# Patient Record
Sex: Male | Born: 1965 | Race: White | Hispanic: No | Marital: Married | Smoking: Never smoker
Health system: Southern US, Community
[De-identification: ages and names within clinical notes are randomized; demographics above are authoritative.]

## PROBLEM LIST (undated history)

## (undated) DIAGNOSIS — E785 Hyperlipidemia, unspecified: Secondary | ICD-10-CM

## (undated) HISTORY — DX: Hyperlipidemia, unspecified: E78.5

---

## 2002-11-23 ENCOUNTER — Ambulatory Visit (HOSPITAL_COMMUNITY): Admission: RE | Admit: 2002-11-23 | Discharge: 2002-11-23 | Payer: Self-pay | Admitting: Internal Medicine

## 2005-01-15 ENCOUNTER — Ambulatory Visit: Payer: Self-pay | Admitting: Cardiology

## 2005-01-22 ENCOUNTER — Ambulatory Visit: Payer: Self-pay | Admitting: Cardiology

## 2015-10-08 MED FILL — RELPAX 40 MG TABLET: 40 | 30 days supply | Qty: 9 | Fill #0

## 2015-11-08 MED FILL — RELPAX 40 MG TABLET: 40 | 30 days supply | Qty: 9 | Fill #0

## 2015-12-06 MED FILL — RELPAX 40 MG TABLET: 40 | 30 days supply | Qty: 9 | Fill #0

## 2016-01-08 MED FILL — RELPAX 40 MG TABLET: 40 | 30 days supply | Qty: 9 | Fill #1

## 2016-02-05 MED FILL — RELPAX 40 MG TABLET: 40 | 30 days supply | Qty: 9 | Fill #0

## 2016-03-09 MED FILL — RELPAX 40 MG TABLET: 40 | 30 days supply | Qty: 9 | Fill #0

## 2016-04-16 MED FILL — RELPAX 40 MG TABLET: 40 | 30 days supply | Qty: 9 | Fill #0

## 2016-05-14 MED FILL — ELETRIPTAN HBR 40 MG TABLET: 40 | 30 days supply | Qty: 9 | Fill #1

## 2016-06-10 MED FILL — ELETRIPTAN HBR 40 MG TABLET: 40 | 90 days supply | Qty: 27 | Fill #0

## 2016-07-29 ENCOUNTER — Encounter (INDEPENDENT_AMBULATORY_CARE_PROVIDER_SITE_OTHER): Payer: Self-pay | Admitting: Sports Medicine

## 2016-07-31 ENCOUNTER — Other Ambulatory Visit (INDEPENDENT_AMBULATORY_CARE_PROVIDER_SITE_OTHER): Payer: Self-pay | Admitting: Sports Medicine

## 2016-07-31 MED ORDER — AMOXICILLIN-POT CLAVULANATE 875-125 MG PO TABS: 1.0000 | ORAL_TABLET | Freq: Two times a day (BID) | ORAL | 0 refills | Status: DC

## 2016-07-31 NOTE — Progress Notes (Signed)
Tamiflu called in on Wednesday night due to persistent myalgias & fevers. Patient is asplenic & will call in additional prescription for Augmentin for prophylactic treatment.

## 2016-08-18 ENCOUNTER — Other Ambulatory Visit (INDEPENDENT_AMBULATORY_CARE_PROVIDER_SITE_OTHER): Payer: Self-pay | Admitting: Family

## 2016-08-18 MED ORDER — ELETRIPTAN HYDROBROMIDE 40 MG PO TABS: 40.0000 mg | ORAL_TABLET | ORAL | 3 refills | Status: DC | PRN

## 2016-09-02 MED FILL — ELETRIPTAN HBR 40 MG TABLET: 40 | 90 days supply | Qty: 27 | Fill #0

## 2016-09-24 ENCOUNTER — Telehealth (INDEPENDENT_AMBULATORY_CARE_PROVIDER_SITE_OTHER): Payer: Self-pay | Admitting: Sports Medicine

## 2016-09-24 MED ORDER — AMOXICILLIN-POT CLAVULANATE 875-125 MG PO TABS: 1.0000 | ORAL_TABLET | Freq: Two times a day (BID) | ORAL | 0 refills | Status: DC

## 2016-09-24 MED FILL — AMOX-CLAV 875-125 MG TABLET: 875-125 | 10 days supply | Qty: 20 | Fill #0

## 2016-09-24 NOTE — Telephone Encounter (Signed)
Due to his worsening reductive cough with fevers of 100.7F.  His been having writers and mild DOE without associated respiratory distress.  History of splenectomy.  We'll then treat empirically with 10 days of Augmentin.

## 2016-11-12 ENCOUNTER — Other Ambulatory Visit (INDEPENDENT_AMBULATORY_CARE_PROVIDER_SITE_OTHER): Payer: Self-pay | Admitting: Radiology

## 2016-11-12 MED ORDER — ELETRIPTAN HYDROBROMIDE 40 MG PO TABS: 40.0000 mg | ORAL_TABLET | ORAL | 3 refills | Status: DC | PRN

## 2016-11-13 ENCOUNTER — Ambulatory Visit (INDEPENDENT_AMBULATORY_CARE_PROVIDER_SITE_OTHER): Payer: Self-pay | Admitting: Family

## 2016-11-13 ENCOUNTER — Encounter (INDEPENDENT_AMBULATORY_CARE_PROVIDER_SITE_OTHER): Payer: Self-pay

## 2016-11-13 DIAGNOSIS — R519 Headache, unspecified: Secondary | ICD-10-CM

## 2016-11-13 DIAGNOSIS — R51 Headache: Secondary | ICD-10-CM

## 2016-11-13 MED FILL — ELETRIPTAN HBR 40 MG TABLET: 40 | 30 days supply | Qty: 12 | Fill #0

## 2016-11-19 NOTE — Progress Notes (Signed)
   Office Visit Note   Patient: Walter PressmanGregory S Jennings           Date of Birth: 05-08-1966           MRN: [id number] Visit Date: 11/13/2016              Requested by: No referring provider defined for this encounter. PCP: No primary care provider on file.   Assessment & Plan: Visit Diagnoses: No diagnosis found.  Plan: Relpax refilled. Will follow up as needed.   Follow-Up Instructions: Return if symptoms worsen or fail to improve.   Orders:  No orders of the defined types were placed in this encounter.  No orders of the defined types were placed in this encounter.     Procedures: No procedures performed   Clinical Data: No additional findings.   Subjective: No chief complaint on file.   The patient is a [age] year old gentleman who presents today for evaluation of a intermittent headaches. This has been ongoing for many years. Has been obtaining good relief with Relpax for quite sometime.     Review of Systems  Constitutional: Negative for chills and fever.  Eyes: Negative for photophobia, pain and visual disturbance.  Gastrointestinal: Negative for nausea.  Musculoskeletal: Negative for neck pain.  Neurological: Positive for headaches. Negative for speech difficulty, weakness and numbness.     Objective: Vital Signs: There were no vitals taken for this visit.  Physical Exam  Constitutional: He is oriented to person, place, and time. He appears well-developed and well-nourished.  HENT:  Head: Normocephalic and atraumatic.  Eyes: EOM are normal. Pupils are equal, round, and reactive to light.  Neck: Normal range of motion. Neck supple.  Pulmonary/Chest: Effort normal.  Neurological: He is alert and oriented to person, place, and time. No cranial nerve deficit. He exhibits normal muscle tone. Coordination normal.  Skin: Skin is warm and dry.  Psychiatric: He has a normal mood and affect. Thought content normal.    Ortho Exam  Specialty Comments:  No specialty  comments available.  Imaging: No results found.   PMFS History: There are no active problems to display for this patient.  No past medical history on file.  No family history on file.  No past surgical history on file. Social History   Occupational History  . Not on file.   Social History Main Topics  . Smoking status: Not on file  . Smokeless tobacco: Not on file  . Alcohol use Not on file  . Drug use: Unknown  . Sexual activity: Not on file

## 2016-12-14 ENCOUNTER — Other Ambulatory Visit (INDEPENDENT_AMBULATORY_CARE_PROVIDER_SITE_OTHER): Payer: Self-pay | Admitting: Radiology

## 2016-12-14 MED ORDER — SCOPOLAMINE 1 MG/3DAYS TD PT72: 2.0000 | MEDICATED_PATCH | TRANSDERMAL | 0 refills | Status: DC

## 2016-12-14 MED FILL — SCOPOLAMINE 1 MG/3 DAY PATC: 1 | 3 days supply | Qty: 2 | Fill #0

## 2017-01-08 ENCOUNTER — Other Ambulatory Visit (INDEPENDENT_AMBULATORY_CARE_PROVIDER_SITE_OTHER): Payer: Self-pay | Admitting: Radiology

## 2017-01-08 MED ORDER — ELETRIPTAN HYDROBROMIDE 40 MG PO TABS: 40.0000 mg | ORAL_TABLET | ORAL | 3 refills | Status: DC | PRN

## 2017-01-08 MED FILL — ELETRIPTAN HBR 40 MG TABLET: 40 | 30 days supply | Qty: 12 | Fill #0

## 2017-03-03 MED FILL — ELETRIPTAN HBR 40 MG TABLET: 40 | 30 days supply | Qty: 12 | Fill #1

## 2017-05-28 ENCOUNTER — Other Ambulatory Visit (INDEPENDENT_AMBULATORY_CARE_PROVIDER_SITE_OTHER): Payer: Self-pay | Admitting: Radiology

## 2017-05-28 MED ORDER — ELETRIPTAN HYDROBROMIDE 40 MG PO TABS: 40.0000 mg | ORAL_TABLET | ORAL | 3 refills | Status: DC | PRN

## 2017-06-02 ENCOUNTER — Other Ambulatory Visit (INDEPENDENT_AMBULATORY_CARE_PROVIDER_SITE_OTHER): Payer: Self-pay | Admitting: Radiology

## 2017-06-02 MED FILL — ELETRIPTAN HBR 40 MG TABLET: 40 | 30 days supply | Qty: 12 | Fill #2

## 2017-07-27 ENCOUNTER — Other Ambulatory Visit (INDEPENDENT_AMBULATORY_CARE_PROVIDER_SITE_OTHER): Payer: Self-pay

## 2017-07-27 MED ORDER — ELETRIPTAN HYDROBROMIDE 40 MG PO TABS: 40.0000 mg | ORAL_TABLET | ORAL | 3 refills | Status: DC | PRN

## 2017-08-02 MED FILL — ELETRIPTAN HBR 40 MG TABLET: 40 | 30 days supply | Qty: 12 | Fill #3

## 2018-01-27 ENCOUNTER — Other Ambulatory Visit (INDEPENDENT_AMBULATORY_CARE_PROVIDER_SITE_OTHER): Payer: Self-pay | Admitting: Radiology

## 2018-01-27 MED ORDER — ELETRIPTAN HYDROBROMIDE 40 MG PO TABS: 40.0000 mg | ORAL_TABLET | ORAL | 3 refills | Status: DC | PRN

## 2018-01-27 MED FILL — ELETRIPTAN HYDROBROMIDE 40: 40 | 30 days supply | Qty: 12 | Fill #0

## 2018-05-16 ENCOUNTER — Other Ambulatory Visit (INDEPENDENT_AMBULATORY_CARE_PROVIDER_SITE_OTHER): Payer: Self-pay | Admitting: Radiology

## 2018-05-16 MED ORDER — ELETRIPTAN HYDROBROMIDE 40 MG PO TABS: 40.0000 mg | ORAL_TABLET | ORAL | 3 refills | Status: DC | PRN

## 2018-05-16 MED FILL — ELETRIPTAN HYDROBROMIDE 40: 40 | 30 days supply | Qty: 8 | Fill #0

## 2018-08-12 ENCOUNTER — Other Ambulatory Visit (INDEPENDENT_AMBULATORY_CARE_PROVIDER_SITE_OTHER): Payer: Self-pay | Admitting: Radiology

## 2018-08-12 MED ORDER — ELETRIPTAN HYDROBROMIDE 40 MG PO TABS: 40.0000 mg | ORAL_TABLET | ORAL | 3 refills | Status: DC | PRN

## 2018-08-12 MED FILL — ELETRIPTAN HYDROBROMIDE 40: 40 | 90 days supply | Qty: 27 | Fill #0

## 2018-08-24 ENCOUNTER — Ambulatory Visit (INDEPENDENT_AMBULATORY_CARE_PROVIDER_SITE_OTHER): Payer: 59 | Admitting: Family

## 2018-08-24 DIAGNOSIS — R519 Headache, unspecified: Secondary | ICD-10-CM

## 2018-08-24 DIAGNOSIS — R51 Headache: Principal | ICD-10-CM

## 2018-08-31 ENCOUNTER — Encounter (INDEPENDENT_AMBULATORY_CARE_PROVIDER_SITE_OTHER): Payer: Self-pay | Admitting: Family

## 2018-08-31 NOTE — Progress Notes (Deleted)
SUBJECTIVE: Walter PressmanGregory S Jennings is a 52 y.o. male who complains of headaches for *** {gen duration:315003}. Description of pain: {headache description:315282}. Duration of individual headaches: *** {gen duration:315003}, frequency {gen frequencies:315330}. Associated symptoms: {hx headache assoc sx:315274}. Pain relief: {headache relief:315277}. Precipitating factors: {headache precipitating factors:315279}. He {has/denies:315300::"denies"} a history of recent head injury.  Prior neurological history: negative for {neuro past hx:315716}. Neurologic Review of Systems - {cns ros:315740}.  Scheduled Meds: Continuous Infusions: PRN Meds:   OBJECTIVE: Appearance: {appearance:315021::"alert, well appearing, and in no distress"}. Neurological Exam: {neuro:315902::"alert, oriented, normal speech, no focal findings or movement disorder noted"}.  ASSESSMENT: {headache NW:295621}dx:315280}.  PLAN: Recommendations: {headache recommend:315281::"lie in darkened room and apply cold packs prn for pain","side effect profile discussed in detail","asked to keep headache diary","patient reassured that neurodiagnostic workup not indicated from benign H & P"}. See orders for this visit as documented in the electronic medical record.

## 2018-09-09 DIAGNOSIS — H5712 Ocular pain, left eye: Secondary | ICD-10-CM | POA: Diagnosis not present

## 2018-12-16 ENCOUNTER — Other Ambulatory Visit (INDEPENDENT_AMBULATORY_CARE_PROVIDER_SITE_OTHER): Payer: Self-pay | Admitting: Family Medicine

## 2018-12-16 MED ORDER — LEVOFLOXACIN 750 MG PO TABS: 750.0000 mg | ORAL_TABLET | Freq: Every day | ORAL | 0 refills | Status: DC

## 2018-12-16 MED FILL — ELETRIPTAN HYDROBROMIDE 40: 40 | 90 days supply | Qty: 27 | Fill #1

## 2018-12-19 MED FILL — levoFLOXacin 750 MG TABS: 750 | 14 days supply | Qty: 14 | Fill #0

## 2018-12-23 ENCOUNTER — Ambulatory Visit (INDEPENDENT_AMBULATORY_CARE_PROVIDER_SITE_OTHER): Payer: 59 | Admitting: Family Medicine

## 2018-12-23 ENCOUNTER — Encounter (INDEPENDENT_AMBULATORY_CARE_PROVIDER_SITE_OTHER): Payer: Self-pay | Admitting: Family Medicine

## 2018-12-23 ENCOUNTER — Other Ambulatory Visit: Payer: Self-pay

## 2018-12-23 DIAGNOSIS — R0981 Nasal congestion: Secondary | ICD-10-CM

## 2018-12-23 DIAGNOSIS — Z8669 Personal history of other diseases of the nervous system and sense organs: Secondary | ICD-10-CM

## 2018-12-23 MED ORDER — ELETRIPTAN HYDROBROMIDE 40 MG PO TABS: 40.0000 mg | ORAL_TABLET | ORAL | 11 refills | Status: DC | PRN

## 2018-12-23 NOTE — Progress Notes (Signed)
Subjective: Patient had respiratory illness last week while out of town.  Symptoms have improved significantly, now feeling back to normal.  Levaquin was prescribed.  Impression: Resolved respiratory illness  Plan: Follow-up as needed.

## 2018-12-26 ENCOUNTER — Other Ambulatory Visit (INDEPENDENT_AMBULATORY_CARE_PROVIDER_SITE_OTHER): Payer: Self-pay | Admitting: Family Medicine

## 2018-12-26 ENCOUNTER — Telehealth (INDEPENDENT_AMBULATORY_CARE_PROVIDER_SITE_OTHER): Payer: Self-pay | Admitting: Family Medicine

## 2018-12-26 MED ORDER — CHLOROQUINE PHOSPHATE 500 MG PO TABS: 500.0000 mg | ORAL_TABLET | Freq: Two times a day (BID) | ORAL | 1 refills | Status: DC

## 2018-12-26 MED ORDER — HYDROXYCHLOROQUINE SULFATE 200 MG PO TABS: 200.0000 mg | ORAL_TABLET | Freq: Three times a day (TID) | ORAL | 1 refills | Status: DC

## 2018-12-26 MED ORDER — AZITHROMYCIN 250 MG PO TABS: ORAL_TABLET | ORAL | 0 refills | Status: DC

## 2018-12-26 NOTE — Telephone Encounter (Signed)
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  He has family member with autoimmune disease who is at high risk for complications related to contracting Covid-19.  He understands it is off-label use, but we have chosen to call in chloroquine and azithromycin to be used only if he develops symptoms consistent with Covid-19 infection.

## 2018-12-28 ENCOUNTER — Telehealth (INDEPENDENT_AMBULATORY_CARE_PROVIDER_SITE_OTHER): Payer: Self-pay | Admitting: Family Medicine

## 2018-12-28 ENCOUNTER — Encounter (INDEPENDENT_AMBULATORY_CARE_PROVIDER_SITE_OTHER): Payer: Self-pay | Admitting: Family Medicine

## 2018-12-28 DIAGNOSIS — Z9081 Acquired absence of spleen: Secondary | ICD-10-CM | POA: Insufficient documentation
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
## 2019-08-21 NOTE — Progress Notes (Signed)
Referring-Self Referral Reason for referral-Hyperlipidemia  HPI: 53 year old male for evaluation of hyperlipidemia.  Patient has dyspnea with more vigorous activities.  Not routine activities.  No orthopnea, PND, pedal edema, chest pain or syncope.  Current Outpatient Medications  Medication Sig Dispense Refill  . amoxicillin-clavulanate (AUGMENTIN) 875-125 MG tablet Take 1 tablet by mouth 2 (two) times daily. 20 tablet 0  . azithromycin (ZITHROMAX Z-PAK) 250 MG tablet Take as directed. 6 each 0  . chloroquine (ARALEN) 500 MG tablet Take 1 tablet (500 mg total) by mouth 2 (two) times daily. 12 tablet 1  . eletriptan (RELPAX) 40 MG tablet Take 1 tablet (40 mg total) by mouth as needed for migraine or headache. May repeat in 2 hours if headache persists or recurs. 30 tablet 11  . hydroxychloroquine (PLAQUENIL) 200 MG tablet Take 1 tablet (200 mg total) by mouth 3 (three) times daily. 30 tablet 1  . levofloxacin (LEVAQUIN) 750 MG tablet Take 1 tablet (750 mg total) by mouth daily. 14 tablet 0  . scopolamine (TRANSDERM-SCOP, 1.5 MG,) 1 MG/3DAYS Place 2 patches (3 mg total) onto the skin every 3 (three) days. 2 patch 0   No current facility-administered medications for this visit.     Not on File  No past medical history on file.  No past surgical history on file.  Social History   Socioeconomic History  . Marital status: Married    Spouse name: Not on file  . Number of children: Not on file  . Years of education: Not on file  . Highest education level: Not on file  Occupational History  . Not on file  Social Needs  . Financial resource strain: Not on file  . Food insecurity    Worry: Not on file    Inability: Not on file  . Transportation needs    Medical: Not on file    Non-medical: Not on file  Tobacco Use  . Smoking status: Not on file  Substance and Sexual Activity  . Alcohol use: Not on file  . Drug use: Not on file  . Sexual activity: Not on file  Lifestyle  .  Physical activity    Days per week: Not on file    Minutes per session: Not on file  . Stress: Not on file  Relationships  . Social Musician on phone: Not on file    Gets together: Not on file    Attends religious service: Not on file    Active member of club or organization: Not on file    Attends meetings of clubs or organizations: Not on file    Relationship status: Not on file  . Intimate partner violence    Fear of current or ex partner: Not on file    Emotionally abused: Not on file    Physically abused: Not on file    Forced sexual activity: Not on file  Other Topics Concern  . Not on file  Social History Narrative  . Not on file    No family history on file.  ROS: no fevers or chills, productive cough, hemoptysis, dysphasia, odynophagia, melena, hematochezia, dysuria, hematuria, rash, seizure activity, orthopnea, PND, pedal edema, claudication. Remaining systems are negative.  Physical Exam:   There were no vitals taken for this visit.  General:  Well developed/well nourished in NAD Skin warm/dry Patient not depressed No peripheral clubbing Back-normal HEENT-normal/normal eyelids Neck supple/normal carotid upstroke bilaterally; no bruits; no JVD; no thyromegaly chest -  CTA/ normal expansion CV - RRR/normal S1 and S2; no murmurs, rubs or gallops;  PMI nondisplaced Abdomen -NT/ND, no HSM, no mass, + bowel sounds, no bruit 2+ femoral pulses, no bruits Ext-no edema, chords, 2+ DP Neuro-grossly nonfocal  ECG -sinus bradycardia, RV conduction delay, no ST changes.  Personally reviewed  A/P  1 hyperlipidemia-patient has a history of hyperlipidemia treated with statin.  He is presently not taking this medication.  We will check baseline lipids and treat accordingly.  2 dyspnea-mild dyspnea with vigorous activities.  Also with family history of coronary disease and hyperlipidemia.  We will arrange a calcium score for risk stratification.  Kirk Ruths, MD

## 2019-08-22 ENCOUNTER — Ambulatory Visit (INDEPENDENT_AMBULATORY_CARE_PROVIDER_SITE_OTHER): Payer: 59 | Admitting: Family Medicine

## 2019-08-22 ENCOUNTER — Encounter: Payer: Self-pay | Admitting: Family Medicine

## 2019-08-22 ENCOUNTER — Other Ambulatory Visit: Payer: Self-pay

## 2019-08-22 DIAGNOSIS — M546 Pain in thoracic spine: Secondary | ICD-10-CM

## 2019-08-22 DIAGNOSIS — M542 Cervicalgia: Secondary | ICD-10-CM

## 2019-08-22 DIAGNOSIS — M545 Low back pain, unspecified: Secondary | ICD-10-CM

## 2019-08-22 MED ORDER — TIZANIDINE HCL 2 MG PO TABS: 2.0000 mg | ORAL_TABLET | Freq: Four times a day (QID) | ORAL | 3 refills | Status: DC | PRN
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
## 2019-09-04 ENCOUNTER — Other Ambulatory Visit: Payer: Self-pay

## 2019-09-04 ENCOUNTER — Encounter: Payer: Self-pay | Admitting: Cardiology

## 2019-09-04 ENCOUNTER — Ambulatory Visit (INDEPENDENT_AMBULATORY_CARE_PROVIDER_SITE_OTHER): Payer: 59 | Admitting: Cardiology

## 2019-09-04 VITALS — BP 118/74 | HR 58 | Temp 97.8°F | Ht 71.0 in | Wt 176.0 lb

## 2019-09-04 DIAGNOSIS — E7800 Pure hypercholesterolemia, unspecified: Secondary | ICD-10-CM | POA: Diagnosis not present

## 2019-09-04 DIAGNOSIS — R0602 Shortness of breath: Secondary | ICD-10-CM | POA: Diagnosis not present

## 2019-09-04 NOTE — Patient Instructions (Signed)
Medication Instructions:  NO CHANGE *If you need a refill on your cardiac medications before your next appointment, please call your pharmacy*  Lab Work: If you have labs (blood work) drawn today and your tests are completely normal, you will receive your results only by: Marland Kitchen MyChart Message (if you have MyChart) OR . A paper copy in the mail If you have any lab test that is abnormal or we need to change your treatment, we will call you to review the results.  Testing/Procedures: CARDIAC CALCIUM SCORE AT [place]  Follow-Up: At [facility], you and your health needs are our priority.  As part of our continuing mission to provide you with exceptional heart care, we have created designated Provider Care Teams.  These Care Teams include your primary Cardiologist (physician) and Advanced Practice Providers (APPs -  Physician Assistants and Nurse Practitioners) who all work together to provide you with the care you need, when you need it.  Your physician recommends that you schedule a follow-up appointment in: AS NEEDED

## 2019-09-04 NOTE — Addendum Note (Signed)
Addended by: Lelon Perla on: 09/04/2019 12:14 PM   Modules accepted: Level of Service

## 2019-09-22 DIAGNOSIS — H5203 Hypermetropia, bilateral: Secondary | ICD-10-CM | POA: Diagnosis not present

## 2019-09-22 DIAGNOSIS — H52203 Unspecified astigmatism, bilateral: Secondary | ICD-10-CM | POA: Diagnosis not present

## 2019-09-22 DIAGNOSIS — H524 Presbyopia: Secondary | ICD-10-CM | POA: Diagnosis not present

## 2019-10-11 ENCOUNTER — Ambulatory Visit (INDEPENDENT_AMBULATORY_CARE_PROVIDER_SITE_OTHER)
Admission: RE | Admit: 2019-10-11 | Discharge: 2019-10-11 | Disposition: A | Discharge status: Home / Self Care | Payer: Self-pay | Source: Ambulatory Visit | Attending: Cardiology | Admitting: Cardiology

## 2019-10-11 ENCOUNTER — Other Ambulatory Visit: Payer: Self-pay

## 2019-10-11 DIAGNOSIS — R0602 Shortness of breath: Secondary | ICD-10-CM

## 2019-10-12 ENCOUNTER — Other Ambulatory Visit: Payer: Self-pay

## 2019-10-12 ENCOUNTER — Other Ambulatory Visit: Payer: Self-pay | Admitting: Family Medicine

## 2019-10-12 MED ORDER — ELETRIPTAN HYDROBROMIDE 40 MG PO TABS: 40.0000 mg | ORAL_TABLET | ORAL | 11 refills | Status: DC | PRN

## 2019-10-12 NOTE — Progress Notes (Signed)
Patient called wanted medication sent to [facility] outpatient pharmacy instead. I submitted rx.

## 2019-10-16 ENCOUNTER — Encounter: Payer: Self-pay | Admitting: Family Medicine

## 2019-10-17 ENCOUNTER — Ambulatory Visit: Payer: 59 | Admitting: Family Medicine

## 2019-10-17 ENCOUNTER — Other Ambulatory Visit: Payer: Self-pay | Admitting: Family Medicine

## 2019-10-17 MED ORDER — SUMATRIPTAN SUCCINATE 100 MG PO TABS: 100.0000 mg | ORAL_TABLET | ORAL | 6 refills | Status: AC | PRN

## 2019-10-17 MED FILL — SUMAtriptan SUCCINATE 100 M: 100 | 30 days supply | Qty: 9 | Fill #0

## 2019-10-18 ENCOUNTER — Other Ambulatory Visit: Payer: Self-pay | Admitting: Family Medicine

## 2019-10-18 MED ORDER — ELETRIPTAN HYDROBROMIDE 40 MG PO TABS: 40.0000 mg | ORAL_TABLET | ORAL | 11 refills | Status: DC | PRN

## 2019-10-20 ENCOUNTER — Other Ambulatory Visit: Payer: Self-pay

## 2019-10-20 ENCOUNTER — Encounter: Payer: Self-pay | Admitting: Family Medicine

## 2019-10-20 ENCOUNTER — Ambulatory Visit (INDEPENDENT_AMBULATORY_CARE_PROVIDER_SITE_OTHER): Payer: 59 | Admitting: Family Medicine

## 2019-10-20 ENCOUNTER — Ambulatory Visit: Payer: Self-pay

## 2019-10-20 VITALS — BP 115/81 | HR 74

## 2019-10-20 DIAGNOSIS — M542 Cervicalgia: Secondary | ICD-10-CM

## 2019-10-20 DIAGNOSIS — M546 Pain in thoracic spine: Secondary | ICD-10-CM

## 2019-10-20 DIAGNOSIS — M545 Low back pain, unspecified: Secondary | ICD-10-CM

## 2019-10-20 DIAGNOSIS — R519 Headache, unspecified: Secondary | ICD-10-CM | POA: Diagnosis not present

## 2019-10-20 MED FILL — ELETRIPTAN HBR 40 MG TABLET: 40 | 30 days supply | Qty: 12 | Fill #0
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
## 2019-12-27 ENCOUNTER — Telehealth: Payer: Self-pay | Admitting: Cardiology

## 2019-12-27 DIAGNOSIS — E7800 Pure hypercholesterolemia, unspecified: Secondary | ICD-10-CM

## 2019-12-27 NOTE — Telephone Encounter (Signed)
Wife of the patient called. The patient had labs done at [facility]. The wife got the results and wanted to know how she can pass them along to Dr. Jens Som. Please advise

## 2019-12-27 NOTE — Telephone Encounter (Signed)
Attempted to reach the patient. The wife is not listed on the dpr. Message has not been left. Will try back later.

## 2019-12-29 NOTE — Telephone Encounter (Signed)
Spoke with the pts wife and she is aware that she is not on the DPR she had already to sent the pt lab results she wants looked at to Dr. Ludwig Clarks  cell phone... I asked her to ask Dr. August Saucer about his My Chart and if she had access to that she could send directly to Dr. Jens Som and have it in the pts chart. She says she will work on it and talk with him about the DPR.

## 2019-12-29 NOTE — Telephone Encounter (Signed)
New message:     Patient wife calling to give some information to the doctor. There is a note concerning this matter. Please call patient wife.

## 2020-01-01 NOTE — Telephone Encounter (Signed)
Left message for pt to call, per dr Jens Som he will need to start crestor 20 mg once daily. He will need lipid and hepatic checked in 12 weeks. The patient needs to send [place] a copy of his lab work for his chart.

## 2020-01-02 ENCOUNTER — Encounter: Payer: Self-pay | Admitting: *Deleted

## 2020-01-02 NOTE — Telephone Encounter (Signed)
Message sent to patient vis my chart to find out what pharmacy to send prescription to.

## 2020-01-03 MED ORDER — ROSUVASTATIN CALCIUM 20 MG PO TABS: 20.0000 mg | ORAL_TABLET | Freq: Every day | ORAL | 3 refills | Status: DC

## 2020-01-03 NOTE — Telephone Encounter (Signed)
Spoke with pt, Aware of dr crenshaw's recommendations. New script sent to the pharmacy and Lab orders mailed to the pt  

## 2020-01-19 MED FILL — ROSUVASTATIN CALCIUM 20 MG: 20 | 90 days supply | Qty: 90 | Fill #0

## 2020-03-06 ENCOUNTER — Other Ambulatory Visit: Payer: Self-pay | Admitting: Family Medicine

## 2020-03-06 MED ORDER — ELETRIPTAN HYDROBROMIDE 40 MG PO TABS: 40.0000 mg | ORAL_TABLET | ORAL | 11 refills | Status: DC | PRN

## 2020-03-06 MED FILL — ELETRIPTAN HYDROBROMIDE 40: 40 | 30 days supply | Qty: 12 | Fill #0

## 2020-03-19 MED FILL — ELETRIPTAN HYDROBROMIDE 40: 40 | 30 days supply | Qty: 12 | Fill #0

## 2020-08-07 MED FILL — ELETRIPTAN HYDROBROMIDE 40: 40 | 30 days supply | Qty: 12 | Fill #1

## 2020-10-01 ENCOUNTER — Telehealth: Payer: Self-pay

## 2020-10-01 NOTE — Telephone Encounter (Signed)
Pt is not a  Pt of Dr. Blair Heys, however, he is wanting to meet with Dr Artis Flock about a few immunization questions. Told pt I would reach out and see what we could do about getting him in .

## 2020-10-02 NOTE — Telephone Encounter (Signed)
Pt wants to come in tomorrow at 8.

## 2020-10-02 NOTE — Telephone Encounter (Signed)
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 Aw

## 2020-10-03 ENCOUNTER — Ambulatory Visit: Payer: 59 | Admitting: Family Medicine

## 2020-10-03 ENCOUNTER — Encounter: Payer: Self-pay | Admitting: Family Medicine

## 2020-10-03 ENCOUNTER — Other Ambulatory Visit: Payer: Self-pay

## 2020-10-03 VITALS — BP 130/78 | HR 68 | Temp 97.9°F | Ht 71.0 in | Wt 178.2 lb

## 2020-10-03 DIAGNOSIS — Z7185 Encounter for immunization safety counseling: Secondary | ICD-10-CM

## 2020-10-03 NOTE — Progress Notes (Signed)
Patient: Walter Jennings MRN: [id number] DOB: 1966-06-14 PCP: Patient, No Pcp Per     Subjective:  Chief Complaint  Patient presents with  . Immunizations    HPI: The patient is a 54 y.o. male who presents today to discuss vaccinations. No concerns. Overall very healthy. Has had a splenectomy. On crestor 20mg  secondary to hyperlipidemia px'd by cardiology. Calcium score of zero.   Review of Systems  Constitutional: Negative for chills, fatigue and fever.  HENT: Negative for dental problem, ear pain, hearing loss and trouble swallowing.   Eyes: Negative for visual disturbance.  Respiratory: Negative for cough, chest tightness and shortness of breath.   Cardiovascular: Negative for chest pain, palpitations and leg swelling.  Gastrointestinal: Negative for abdominal pain, blood in stool, diarrhea and nausea.  Endocrine: Negative for cold intolerance, polydipsia, polyphagia and polyuria.  Genitourinary: Negative for dysuria and hematuria.  Musculoskeletal: Negative for arthralgias.  Skin: Negative for rash.  Neurological: Negative for dizziness and headaches.  Psychiatric/Behavioral: Negative for dysphoric mood and sleep disturbance. The patient is not nervous/anxious.     Allergies Patient has No Known Allergies.  Past Medical History Patient  has a past medical history of Hyperlipidemia.  Surgical History Patient  has a past surgical history that includes Splenectomy and Anterior cruciate ligament repair.  Family History Pateint's family history includes CAD in his father.  Social History Patient  reports that he has never smoked. He has never used smokeless tobacco. He reports previous alcohol use.    Objective: Vitals:   10/03/20 1300  BP: 130/78  Pulse: 68  Temp: 97.9 F (36.6 C)  TempSrc: Temporal  SpO2: 97%  Weight: 178 lb 3.2 oz (80.8 kg)  Height: 5\' 11"  (1.803 m)    Body mass index is 24.85 kg/m.  Physical Exam Vitals reviewed.  Constitutional:       Appearance: Normal appearance. He is normal weight.  Pulmonary:     Effort: Pulmonary effort is normal.  Neurological:     General: No focal deficit present.     Mental Status: He is alert and oriented to person, place, and time.  Psychiatric:        Mood and Affect: Mood normal.        Behavior: Behavior normal.        Assessment/plan: 1. Vaccine counseling Discussed vaccines. Needs to look into records for tdap. Has had his covid vaccines, not in chart though. S/p splenectomy: needs pneumonia vaccines, check records for this. F/u as needed/annual.    This visit occurred during the SARS-CoV-2 public health emergency.  Safety protocols were in place, including screening questions prior to the visit, additional usage of staff PPE, and extensive cleaning of exam room while observing appropriate contact time as indicated for disinfecting solutions.      Return if symptoms worsen or fail to improve.   10/05/20, MD [facility]   10/03/2020

## 2020-10-07 ENCOUNTER — Telehealth: Payer: Self-pay

## 2020-10-07 NOTE — Telephone Encounter (Signed)
Pt asked if CMA would give him a call. Please advise.

## 2020-10-07 NOTE — Telephone Encounter (Signed)
Pt called again asking to speak with CMA about his friend who has covid. Please advise.

## 2020-10-07 NOTE — Telephone Encounter (Signed)
Returned patients call.  Dr. Artis Flock

## 2020-10-11 MED FILL — ELETRIPTAN HYDROBROMIDE 40: 40 | 30 days supply | Qty: 12 | Fill #2

## 2020-11-22 ENCOUNTER — Other Ambulatory Visit: Payer: Self-pay | Admitting: Family Medicine

## 2020-11-22 MED ORDER — AMOXICILLIN-POT CLAVULANATE 875-125 MG PO TABS: 1.0000 | ORAL_TABLET | Freq: Two times a day (BID) | ORAL | 0 refills | Status: AC

## 2020-11-22 NOTE — Progress Notes (Signed)
Having fevers, flu-like symptoms.  Covid test negative x 3.  Will treat with Augmentin.

## 2020-11-27 ENCOUNTER — Ambulatory Visit (INDEPENDENT_AMBULATORY_CARE_PROVIDER_SITE_OTHER): Payer: 59 | Admitting: Family Medicine

## 2020-11-27 ENCOUNTER — Other Ambulatory Visit: Payer: Self-pay

## 2020-11-27 ENCOUNTER — Ambulatory Visit (INDEPENDENT_AMBULATORY_CARE_PROVIDER_SITE_OTHER): Payer: 59

## 2020-11-27 DIAGNOSIS — R059 Cough, unspecified: Secondary | ICD-10-CM

## 2020-11-27 NOTE — Progress Notes (Signed)
   Office Visit Note   Patient: Walter Jennings           Date of Birth: Apr 11, 1966           MRN: [id number] Visit Date: 11/27/2020 Requested by: No referring provider defined for this encounter. PCP: Patient, No Pcp Per  Subjective: Chief Complaint  Patient presents with  . Other     cough    HPI: He's here with cough.  Last Thursday developed fever of 102 with cough, productive.  Feeling better now with no fever, but still coughing.  No SOB.  Is status post splenectomy.              ROS:   All other systems were reviewed and are negative.  Objective: Vital Signs: There were no vitals taken for this visit.  Physical Exam:  General:  Alert and oriented, in no acute distress. Pulm:  Breathing unlabored. Psy:  Normal mood, congruent affect.  Lungs:  Clear to auscultation with no wheezing, no crackles.   Imaging: XR Chest 2 View  Result Date: 11/27/2020 X-Rays show possible consolidation in left mid lung.  No other abnormalities.   Assessment & Plan: 1.  Improving cough, cannot rule out left lung pneumonia - If takes a turn for the worse, will start augmentin.  Repeat CXR in 1-2 months.     Procedures: No procedures performed        PMFS History: Patient Active Problem List   Diagnosis Date Noted  . 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 History of migraine headaches 12/23/2018   Past Medical History:  Diagnosis Date  . Hyperlipidemia     Family History  Problem Relation Age of Onset  . CAD Father     Past Surgical History:  Procedure Laterality Date  . ANTERIOR CRUCIATE LIGAMENT REPAIR    . SPLENECTOMY     Social History   Occupational History  . Not on file  Tobacco Use  . Smoking status: Never Smoker  . Smokeless tobacco: Never Used  Substance and Sexual Activity  . Alcohol use: Not Currently  . Drug use: Not on file  . Sexual activity: Not on file

## 2020-12-06 DIAGNOSIS — H52203 Unspecified astigmatism, bilateral: Secondary | ICD-10-CM | POA: Diagnosis not present

## 2020-12-06 DIAGNOSIS — H524 Presbyopia: Secondary | ICD-10-CM | POA: Diagnosis not present

## 2020-12-06 DIAGNOSIS — H5213 Myopia, bilateral: Secondary | ICD-10-CM | POA: Diagnosis not present

## 2021-01-09 ENCOUNTER — Other Ambulatory Visit (HOSPITAL_COMMUNITY): Payer: Self-pay

## 2021-01-09 ENCOUNTER — Other Ambulatory Visit: Payer: Self-pay | Admitting: Cardiology

## 2021-01-09 DIAGNOSIS — E7800 Pure hypercholesterolemia, unspecified: Secondary | ICD-10-CM

## 2021-01-09 MED FILL — Eletriptan Hydrobromide Tab 40 MG (Base Equivalent): ORAL | 90 days supply | Qty: 30 | Fill #0 | Status: CN

## 2021-01-10 ENCOUNTER — Other Ambulatory Visit (HOSPITAL_COMMUNITY): Payer: Self-pay

## 2021-01-10 MED ORDER — ROSUVASTATIN CALCIUM 20 MG PO TABS
20.0000 mg | ORAL_TABLET | Freq: Every day | ORAL | 0 refills | Status: AC
  Filled 2021-01-10 – 2021-01-29 (×2): qty 90, 90d supply, fill #0

## 2021-01-21 ENCOUNTER — Other Ambulatory Visit (HOSPITAL_COMMUNITY): Payer: Self-pay

## 2021-01-29 ENCOUNTER — Other Ambulatory Visit (HOSPITAL_COMMUNITY): Payer: Self-pay

## 2021-01-29 MED FILL — Eletriptan Hydrobromide Tab 40 MG (Base Equivalent): ORAL | 30 days supply | Qty: 12 | Fill #0 | Status: AC

## 2021-03-19 ENCOUNTER — Other Ambulatory Visit (HOSPITAL_COMMUNITY): Payer: Self-pay

## 2021-03-19 ENCOUNTER — Other Ambulatory Visit: Payer: Self-pay | Admitting: Family Medicine

## 2021-03-19 MED ORDER — ELETRIPTAN HYDROBROMIDE 40 MG PO TABS
ORAL_TABLET | ORAL | 11 refills | Status: DC
  Filled 2021-03-19: qty 12, 30d supply, fill #0
  Filled 2021-05-23 – 2021-09-10 (×3): qty 12, 30d supply, fill #1
  Filled 2022-01-23: qty 12, 30d supply, fill #2
  Filled 2022-02-20: qty 24, 60d supply, fill #2

## 2021-03-20 ENCOUNTER — Other Ambulatory Visit (HOSPITAL_COMMUNITY): Payer: Self-pay

## 2021-05-23 ENCOUNTER — Other Ambulatory Visit (HOSPITAL_COMMUNITY): Payer: Self-pay

## 2021-09-10 ENCOUNTER — Other Ambulatory Visit (HOSPITAL_COMMUNITY): Payer: Self-pay

## 2021-10-04 IMAGING — DX DG CHEST 2V
2 series · 2 of 2 positions shown · non-contrast
Comparison: Coronary CTA 10/11/2019

CLINICAL DATA: Cough.  Prior COVID exposure.

EXAM:
CHEST - 2 VIEW

[chest pa]
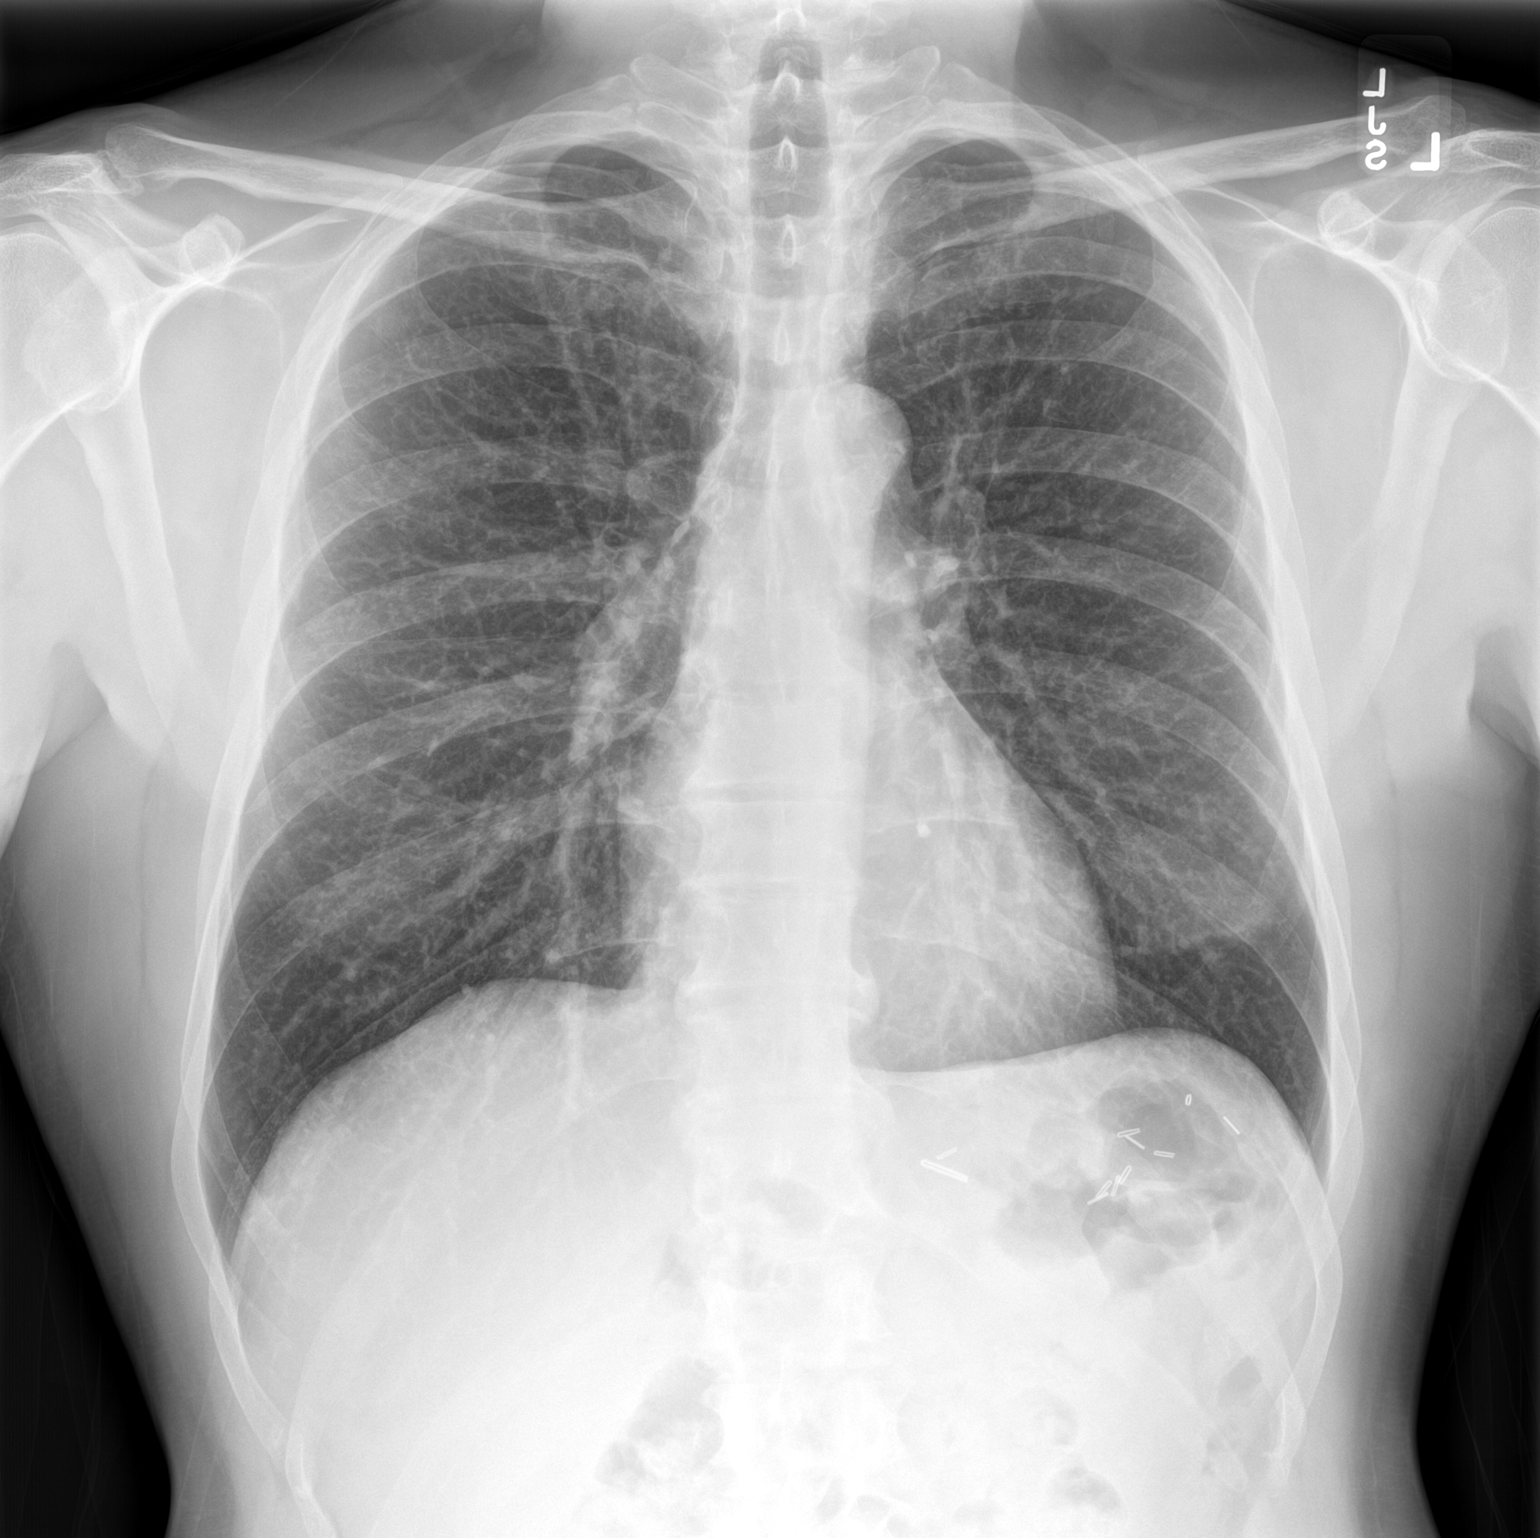

[chest lat]
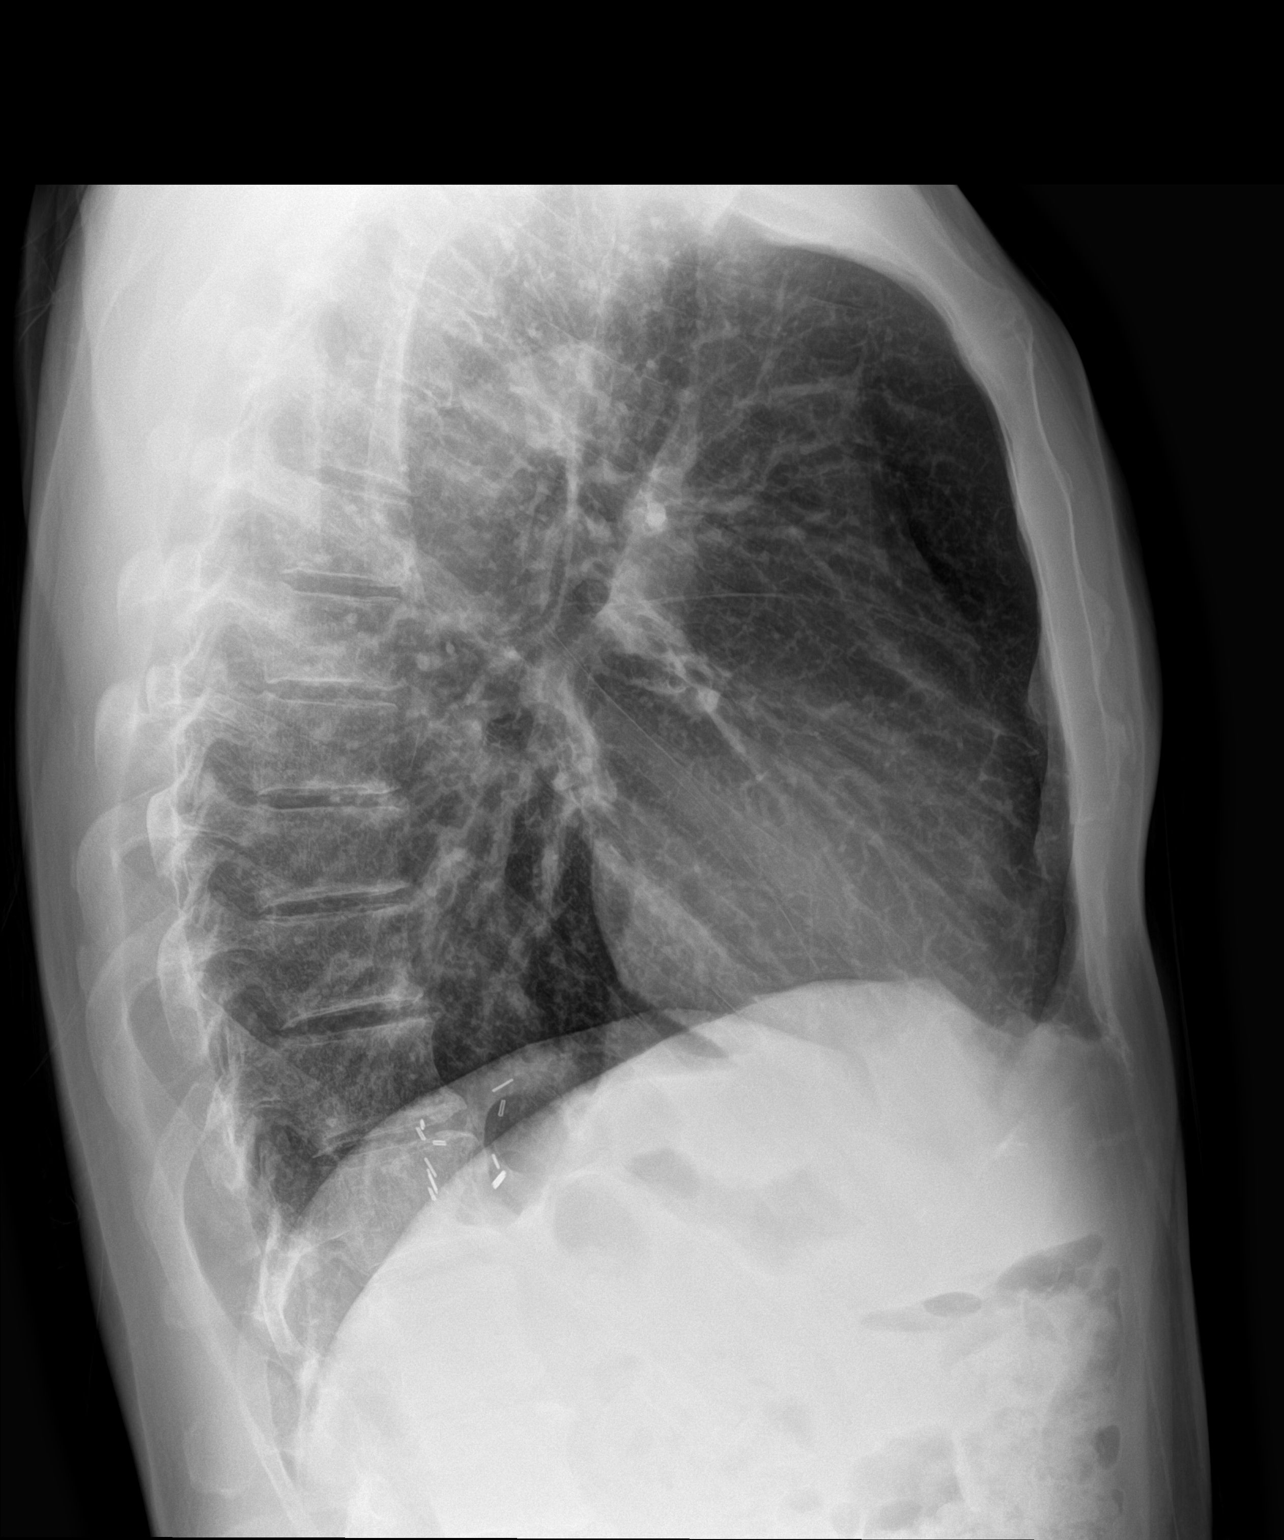

[2 of 2 positions shown; findings below may reference images not displayed]

FINDINGS: Normal cardiac silhouette. No effusion, infiltrate or pneumothorax.
Subtle symmetric densities projecting over the LEFT and RIGHT lower
mid lungs related a soft tissue attenuation from the anterior chest
wall. Lateral projection clear. Splenectomy clips noted.
IMPRESSION: No evidence of viral pneumonia. No acute cardiopulmonary process.

## 2021-10-12 ENCOUNTER — Other Ambulatory Visit: Payer: Self-pay | Admitting: Surgical

## 2021-10-12 DIAGNOSIS — U071 COVID-19: Secondary | ICD-10-CM | POA: Diagnosis not present

## 2021-10-12 DIAGNOSIS — R509 Fever, unspecified: Secondary | ICD-10-CM | POA: Diagnosis not present

## 2021-10-12 DIAGNOSIS — R059 Cough, unspecified: Secondary | ICD-10-CM | POA: Diagnosis not present

## 2021-10-12 MED ORDER — LEVOFLOXACIN 750 MG PO TABS: 750.0000 mg | ORAL_TABLET | Freq: Every day | ORAL | 0 refills | Status: AC

## 2021-10-12 MED ORDER — OSELTAMIVIR PHOSPHATE 75 MG PO CAPS: 75.0000 mg | ORAL_CAPSULE | Freq: Two times a day (BID) | ORAL | 0 refills | Status: AC

## 2022-01-23 ENCOUNTER — Other Ambulatory Visit (HOSPITAL_COMMUNITY): Payer: Self-pay

## 2022-02-02 ENCOUNTER — Other Ambulatory Visit (HOSPITAL_COMMUNITY): Payer: Self-pay

## 2022-02-20 ENCOUNTER — Other Ambulatory Visit (HOSPITAL_COMMUNITY): Payer: Self-pay

## 2022-04-08 DIAGNOSIS — Q6672 Congenital pes cavus, left foot: Secondary | ICD-10-CM | POA: Diagnosis not present

## 2022-04-08 DIAGNOSIS — G5761 Lesion of plantar nerve, right lower limb: Secondary | ICD-10-CM | POA: Diagnosis not present

## 2022-04-08 DIAGNOSIS — M21962 Unspecified acquired deformity of left lower leg: Secondary | ICD-10-CM | POA: Diagnosis not present

## 2022-04-08 DIAGNOSIS — Q6671 Congenital pes cavus, right foot: Secondary | ICD-10-CM | POA: Diagnosis not present

## 2022-04-08 DIAGNOSIS — M21961 Unspecified acquired deformity of right lower leg: Secondary | ICD-10-CM | POA: Diagnosis not present

## 2022-05-13 DIAGNOSIS — G5761 Lesion of plantar nerve, right lower limb: Secondary | ICD-10-CM | POA: Diagnosis not present

## 2022-05-13 DIAGNOSIS — M21962 Unspecified acquired deformity of left lower leg: Secondary | ICD-10-CM | POA: Diagnosis not present

## 2022-05-13 DIAGNOSIS — M21961 Unspecified acquired deformity of right lower leg: Secondary | ICD-10-CM | POA: Diagnosis not present

## 2022-06-26 DIAGNOSIS — H5203 Hypermetropia, bilateral: Secondary | ICD-10-CM | POA: Diagnosis not present

## 2022-06-26 DIAGNOSIS — H25043 Posterior subcapsular polar age-related cataract, bilateral: Secondary | ICD-10-CM | POA: Diagnosis not present

## 2022-06-26 DIAGNOSIS — H524 Presbyopia: Secondary | ICD-10-CM | POA: Diagnosis not present

## 2022-09-04 ENCOUNTER — Other Ambulatory Visit: Payer: Self-pay

## 2022-09-04 ENCOUNTER — Other Ambulatory Visit (HOSPITAL_COMMUNITY): Payer: Self-pay

## 2022-09-07 ENCOUNTER — Other Ambulatory Visit (HOSPITAL_COMMUNITY): Payer: Self-pay

## 2022-09-07 MED ORDER — ELETRIPTAN HYDROBROMIDE 40 MG PO TABS
ORAL_TABLET | ORAL | 11 refills | Status: AC
  Filled 2022-09-07: qty 9, 30d supply, fill #0
  Filled 2022-09-16: qty 30, 30d supply, fill #0

## 2022-09-16 ENCOUNTER — Other Ambulatory Visit (HOSPITAL_COMMUNITY): Payer: Self-pay

## 2023-07-16 DIAGNOSIS — H5203 Hypermetropia, bilateral: Secondary | ICD-10-CM | POA: Diagnosis not present

## 2023-07-16 DIAGNOSIS — H524 Presbyopia: Secondary | ICD-10-CM | POA: Diagnosis not present

## 2024-09-11 DIAGNOSIS — Z1211 Encounter for screening for malignant neoplasm of colon: Secondary | ICD-10-CM | POA: Diagnosis not present

## 2024-09-26 ENCOUNTER — Encounter (HOSPITAL_COMMUNITY): Payer: Self-pay

## 2024-09-26 ENCOUNTER — Other Ambulatory Visit (HOSPITAL_COMMUNITY): Payer: Self-pay

## 2024-09-26 MED ORDER — ELETRIPTAN HYDROBROMIDE 40 MG PO TABS
40.0000 mg | ORAL_TABLET | Freq: Every day | ORAL | 11 refills | Status: AC | PRN
  Filled 2024-09-26: qty 9, 9d supply, fill #0
